# Patient Record
Sex: Male | Born: 2000 | Race: White | Hispanic: No | Marital: Single | Smoking: Never smoker
Health system: Southern US, Community
[De-identification: ages and names within clinical notes are randomized; demographics above are authoritative.]

## PROBLEM LIST (undated history)

## (undated) DIAGNOSIS — F909 Attention-deficit hyperactivity disorder, unspecified type: Secondary | ICD-10-CM

## (undated) HISTORY — DX: Attention-deficit hyperactivity disorder, unspecified type: F90.9

---
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
## 2021-09-27 ENCOUNTER — Other Ambulatory Visit (HOSPITAL_BASED_OUTPATIENT_CLINIC_OR_DEPARTMENT_OTHER): Payer: Self-pay
